# Patient Record
Sex: Female | Born: 1970 | Race: White | Hispanic: No | Marital: Married | Smoking: Never smoker
Health system: Southern US, Community
[De-identification: ages and names within clinical notes are randomized; demographics above are authoritative.]

## PROBLEM LIST (undated history)

## (undated) DIAGNOSIS — J45909 Unspecified asthma, uncomplicated: Secondary | ICD-10-CM

## (undated) DIAGNOSIS — I340 Nonrheumatic mitral (valve) insufficiency: Secondary | ICD-10-CM

## (undated) DIAGNOSIS — I480 Paroxysmal atrial fibrillation: Secondary | ICD-10-CM

## (undated) HISTORY — DX: Unspecified asthma, uncomplicated: J45.909

## (undated) HISTORY — DX: Nonrheumatic mitral (valve) insufficiency: I34.0

## (undated) HISTORY — DX: Paroxysmal atrial fibrillation: I48.0

---

## 2012-07-01 ENCOUNTER — Encounter (HOSPITAL_COMMUNITY): Payer: Self-pay

## 2012-07-01 ENCOUNTER — Emergency Department (HOSPITAL_COMMUNITY): Payer: BC Managed Care – PPO

## 2012-07-01 ENCOUNTER — Emergency Department (HOSPITAL_COMMUNITY)
Admission: EM | Admit: 2012-07-01 | Discharge: 2012-07-01 | Disposition: A | Discharge status: Home / Self Care | Payer: BC Managed Care – PPO | Attending: Emergency Medicine | Admitting: Emergency Medicine

## 2012-07-01 DIAGNOSIS — S93499A Sprain of other ligament of unspecified ankle, initial encounter: Secondary | ICD-10-CM | POA: Insufficient documentation

## 2012-07-01 DIAGNOSIS — X500XXA Overexertion from strenuous movement or load, initial encounter: Secondary | ICD-10-CM | POA: Insufficient documentation

## 2012-07-01 DIAGNOSIS — Y92009 Unspecified place in unspecified non-institutional (private) residence as the place of occurrence of the external cause: Secondary | ICD-10-CM | POA: Insufficient documentation

## 2012-07-01 DIAGNOSIS — Y9302 Activity, running: Secondary | ICD-10-CM | POA: Insufficient documentation

## 2012-07-01 DIAGNOSIS — S8253XA Displaced fracture of medial malleolus of unspecified tibia, initial encounter for closed fracture: Secondary | ICD-10-CM | POA: Insufficient documentation

## 2012-07-01 DIAGNOSIS — W010XXA Fall on same level from slipping, tripping and stumbling without subsequent striking against object, initial encounter: Secondary | ICD-10-CM | POA: Insufficient documentation

## 2012-07-01 MED ORDER — OXYCODONE-ACETAMINOPHEN 5-325 MG PO TABS: 1.0000 | ORAL_TABLET | ORAL | Status: DC | PRN

## 2012-07-01 NOTE — ED Provider Notes (Signed)
History     CSN: [id number]  Arrival date & time 07/01/12  2029   First MD Initiated Contact with Patient 07/01/12 2257      Chief Complaint  Patient presents with  . Ankle Pain  . Fall    (Consider location/radiation/quality/duration/timing/severity/associated sxs/prior treatment) Patient is a 42 y.o. female presenting with ankle pain and fall. The history is provided by the patient.  Ankle Pain Fall  She was running to try and prevent her tablet from falling off a deck when she slipped and twisted her left ankle. She's complaining about pain both medially and laterally. Pain is 8/10 if she moves her ankle but 4/10 at rest. She is unable to cannulate because of pain. She denies other injury.  History reviewed. No pertinent past medical history.  History reviewed. No pertinent past surgical history.  No family history on file.  History  Substance Use Topics  . Smoking status: Never Smoker   . Smokeless tobacco: Never Used  . Alcohol Use: No    OB History   Grav Para Term Preterm Abortions TAB SAB Ect Mult Living                  Review of Systems  All other systems reviewed and are negative.    Allergies  Cranberry  Home Medications  No current outpatient prescriptions on file.  BP 105/69  Pulse 91  Temp(Src) 97.7 F (36.5 C) (Oral)  Resp 13  SpO2 98%  LMP 06/24/2012  Physical Exam  Nursing note and vitals reviewed.  42 year old female, resting comfortably and in no acute distress. Vital signs are normal. Oxygen saturation is 98%, which is normal. Head is normocephalic and atraumatic. PERRLA, EOMI. Oropharynx is clear. Neck is nontender and supple without adenopathy or JVD. Back is nontender and there is no CVA tenderness. Lungs are clear without rales, wheezes, or rhonchi. Chest is nontender. Heart has regular rate and rhythm without murmur. Abdomen is soft, flat, nontender without masses or hepatosplenomegaly and peristalsis is  normoactive. Extremities have no cyanosis or edema, full range of motion is present. Left ankle has moderate swelling both medially and laterally with tenderness both medially and laterally. There is no instability of the ankle mortise and anterior drawer sign is negative. Distal pulses are strong and capillary refill is prompt presentation is normal. Skin is warm and dry without rash. Neurologic: Mental status is normal, cranial nerves are intact, there are no motor or sensory deficits.  ED Course  Procedures (including critical care time)  Labs Reviewed - No data to display Dg Ankle Complete Left  07/01/2012  *RADIOLOGY REPORT*  Clinical Data:  Pain and swelling, fell this morning  LEFT ANKLE COMPLETE - 3+ VIEW  Comparison: None  Findings: Soft tissue swelling greatest laterally. Ankle mortise intact. Osseous mineralization normal. Small plantar calcaneal spur. Tiny avulsion fracture identified at tip of medial malleolus. No additional fracture, dislocation, or bone destruction.  IMPRESSION: Tiny avulsion fracture at tip of medial malleolus. Lateral soft tissue swelling without definite fracture.   Original Report Authenticated By: Ulyses Southward, M.D.      1. Fall at home, initial encounter   2. Avulsion fracture of ankle, left, closed, initial encounter       MDM  Fall with sprain of left ankle with avulsion fracture of medial malleolus. She is placed in a Cam Walker and given crutches and given prescription for Percocet for pain. She is referred to orthopedics for followup.  Dione Booze, MD 07/01/12 [phone]

## 2012-07-01 NOTE — ED Notes (Signed)
Patient presents with c/o left ankle pain and swelling after falling down 3 porch steps. No dizziness or syncope. Denies hitting head. No LOC. Patient states that she was trying to prevent her toddler son from going down the steps and she lost her balance. States that she felt a "snap, crackle and popping" sensation immediately after the fall. Reports difficulty with ambulation d/t pain. States that the application of pressure to the LLE causes intense pain.

## 2012-07-01 NOTE — Progress Notes (Signed)
Orthopedic Tech Progress Note Patient Details:  Sarah Cuevas 1970-05-19 [id number]  Ortho Devices Type of Ortho Device: Crutches;CAM walker   Haskell Flirt 07/01/2012, 11:29 PM

## 2012-07-01 NOTE — ED Notes (Signed)
Ice pack provided to patient to apply to left ankle injury

## 2012-11-08 ENCOUNTER — Other Ambulatory Visit (HOSPITAL_COMMUNITY): Payer: Self-pay | Admitting: Family Medicine

## 2012-11-08 DIAGNOSIS — Z1231 Encounter for screening mammogram for malignant neoplasm of breast: Secondary | ICD-10-CM

## 2012-11-15 ENCOUNTER — Ambulatory Visit (HOSPITAL_COMMUNITY)
Admission: RE | Admit: 2012-11-15 | Discharge: 2012-11-15 | Disposition: A | Discharge status: Home / Self Care | Payer: BC Managed Care – PPO | Source: Ambulatory Visit | Attending: Family Medicine | Admitting: Family Medicine

## 2012-11-15 DIAGNOSIS — Z1231 Encounter for screening mammogram for malignant neoplasm of breast: Secondary | ICD-10-CM | POA: Insufficient documentation

## 2013-05-28 ENCOUNTER — Ambulatory Visit (INDEPENDENT_AMBULATORY_CARE_PROVIDER_SITE_OTHER): Payer: BC Managed Care – PPO | Admitting: *Deleted

## 2013-05-28 VITALS — BP 142/91 | Ht 68.0 in | Wt 209.6 lb

## 2013-05-28 DIAGNOSIS — B379 Candidiasis, unspecified: Secondary | ICD-10-CM

## 2013-05-28 DIAGNOSIS — O09529 Supervision of elderly multigravida, unspecified trimester: Secondary | ICD-10-CM

## 2013-05-28 DIAGNOSIS — IMO0002 Reserved for concepts with insufficient information to code with codable children: Secondary | ICD-10-CM

## 2013-05-28 NOTE — Progress Notes (Signed)
P-95  New ob/  Labs done.

## 2013-05-29 LAB — OBSTETRIC PANEL
Antibody Screen: NEGATIVE
BASOS ABS: 0 10*3/uL (ref 0.0–0.1)
Basophils Relative: 0 % (ref 0–1)
EOS PCT: 2 % (ref 0–5)
Eosinophils Absolute: 0.1 10*3/uL (ref 0.0–0.7)
HCT: 40.8 % (ref 36.0–46.0)
Hemoglobin: 14.2 g/dL (ref 12.0–15.0)
Hepatitis B Surface Ag: NEGATIVE
LYMPHS PCT: 21 % (ref 12–46)
Lymphs Abs: 1.7 10*3/uL (ref 0.7–4.0)
MCH: 30.7 pg (ref 26.0–34.0)
MCHC: 34.8 g/dL (ref 30.0–36.0)
MCV: 88.1 fL (ref 78.0–100.0)
Monocytes Absolute: 0.8 10*3/uL (ref 0.1–1.0)
Monocytes Relative: 11 % (ref 3–12)
NEUTROS ABS: 5.1 10*3/uL (ref 1.7–7.7)
Neutrophils Relative %: 66 % (ref 43–77)
PLATELETS: 259 10*3/uL (ref 150–400)
RBC: 4.63 MIL/uL (ref 3.87–5.11)
RDW: 14.1 % (ref 11.5–15.5)
RUBELLA: 1.08 {index} — AB (ref <0.90)
Rh Type: POSITIVE
WBC: 7.8 10*3/uL (ref 4.0–10.5)

## 2013-05-29 LAB — HIV ANTIBODY (ROUTINE TESTING W REFLEX): HIV: NONREACTIVE

## 2013-05-29 LAB — VARICELLA ZOSTER ANTIBODY, IGG: VARICELLA IGG: 812.5 {index} — AB (ref <135.00)

## 2013-05-29 MED ORDER — FLUCONAZOLE 150 MG PO TABS: 150.0000 mg | ORAL_TABLET | Freq: Every day | ORAL | Status: DC

## 2013-05-29 NOTE — Progress Notes (Signed)
Patient also complains of increased itching and white clumpy discharge.  She has had yeast infection in the past and feels like this is what is going on.  She has used Diflucan in the past with good result.  I will call this in for her.

## 2013-05-29 NOTE — Addendum Note (Signed)
Addended by: Barbara CowerNOGUES, Lian Tanori L on: 05/29/2013 11:26 AM   Modules accepted: Orders

## 2013-05-30 LAB — CULTURE, OB URINE

## 2013-05-30 LAB — CYSTIC FIBROSIS DIAGNOSTIC STUDY

## 2013-06-10 ENCOUNTER — Encounter: Payer: BC Managed Care – PPO | Admitting: Family Medicine

## 2014-04-02 ENCOUNTER — Encounter (HOSPITAL_COMMUNITY): Payer: Self-pay | Admitting: *Deleted

## 2014-08-12 ENCOUNTER — Other Ambulatory Visit: Payer: Self-pay | Admitting: Obstetrics & Gynecology

## 2014-08-12 DIAGNOSIS — R928 Other abnormal and inconclusive findings on diagnostic imaging of breast: Secondary | ICD-10-CM

## 2014-08-17 ENCOUNTER — Ambulatory Visit
Admission: RE | Admit: 2014-08-17 | Discharge: 2014-08-17 | Disposition: A | Discharge status: Home / Self Care | Payer: BLUE CROSS/BLUE SHIELD | Source: Ambulatory Visit | Attending: Obstetrics & Gynecology | Admitting: Obstetrics & Gynecology

## 2014-08-17 DIAGNOSIS — R928 Other abnormal and inconclusive findings on diagnostic imaging of breast: Secondary | ICD-10-CM

## 2015-01-25 ENCOUNTER — Other Ambulatory Visit: Payer: Self-pay | Admitting: Obstetrics & Gynecology

## 2015-01-25 DIAGNOSIS — N6489 Other specified disorders of breast: Secondary | ICD-10-CM

## 2015-02-17 ENCOUNTER — Ambulatory Visit
Admission: RE | Admit: 2015-02-17 | Discharge: 2015-02-17 | Disposition: A | Discharge status: Home / Self Care | Payer: BLUE CROSS/BLUE SHIELD | Source: Ambulatory Visit | Attending: Obstetrics & Gynecology | Admitting: Obstetrics & Gynecology

## 2015-02-17 ENCOUNTER — Other Ambulatory Visit: Payer: Self-pay | Admitting: Obstetrics & Gynecology

## 2015-02-17 DIAGNOSIS — N6489 Other specified disorders of breast: Secondary | ICD-10-CM

## 2015-11-15 ENCOUNTER — Other Ambulatory Visit: Payer: Self-pay | Admitting: Obstetrics & Gynecology

## 2015-11-15 DIAGNOSIS — N6489 Other specified disorders of breast: Secondary | ICD-10-CM

## 2015-11-22 ENCOUNTER — Ambulatory Visit
Admission: RE | Admit: 2015-11-22 | Discharge: 2015-11-22 | Disposition: A | Discharge status: Home / Self Care | Payer: BLUE CROSS/BLUE SHIELD | Source: Ambulatory Visit | Attending: Obstetrics & Gynecology | Admitting: Obstetrics & Gynecology

## 2015-11-22 DIAGNOSIS — N6489 Other specified disorders of breast: Secondary | ICD-10-CM

## 2016-10-31 ENCOUNTER — Other Ambulatory Visit: Payer: Self-pay | Admitting: Obstetrics & Gynecology

## 2016-10-31 DIAGNOSIS — N6320 Unspecified lump in the left breast, unspecified quadrant: Secondary | ICD-10-CM

## 2016-11-21 ENCOUNTER — Other Ambulatory Visit: Payer: Self-pay | Admitting: Obstetrics & Gynecology

## 2016-11-21 DIAGNOSIS — N6489 Other specified disorders of breast: Secondary | ICD-10-CM

## 2016-11-22 ENCOUNTER — Ambulatory Visit
Admission: RE | Admit: 2016-11-22 | Discharge: 2016-11-22 | Disposition: A | Discharge status: Home / Self Care | Payer: BLUE CROSS/BLUE SHIELD | Source: Ambulatory Visit | Attending: Obstetrics & Gynecology | Admitting: Obstetrics & Gynecology

## 2016-11-22 ENCOUNTER — Ambulatory Visit: Admission: RE | Admit: 2016-11-22 | Payer: BLUE CROSS/BLUE SHIELD | Source: Ambulatory Visit

## 2016-11-22 DIAGNOSIS — N6489 Other specified disorders of breast: Secondary | ICD-10-CM

## 2017-03-12 DIAGNOSIS — J0100 Acute maxillary sinusitis, unspecified: Secondary | ICD-10-CM | POA: Diagnosis not present

## 2017-05-24 DIAGNOSIS — N926 Irregular menstruation, unspecified: Secondary | ICD-10-CM | POA: Diagnosis not present

## 2017-06-02 DIAGNOSIS — J111 Influenza due to unidentified influenza virus with other respiratory manifestations: Secondary | ICD-10-CM | POA: Diagnosis not present

## 2017-06-02 DIAGNOSIS — J45909 Unspecified asthma, uncomplicated: Secondary | ICD-10-CM | POA: Diagnosis not present

## 2017-06-06 DIAGNOSIS — J40 Bronchitis, not specified as acute or chronic: Secondary | ICD-10-CM | POA: Diagnosis not present

## 2017-06-06 DIAGNOSIS — R404 Transient alteration of awareness: Secondary | ICD-10-CM | POA: Diagnosis not present

## 2017-06-06 DIAGNOSIS — I480 Paroxysmal atrial fibrillation: Secondary | ICD-10-CM | POA: Insufficient documentation

## 2017-06-06 DIAGNOSIS — R55 Syncope and collapse: Secondary | ICD-10-CM | POA: Diagnosis not present

## 2017-06-06 DIAGNOSIS — J111 Influenza due to unidentified influenza virus with other respiratory manifestations: Secondary | ICD-10-CM | POA: Diagnosis not present

## 2017-06-06 DIAGNOSIS — E876 Hypokalemia: Secondary | ICD-10-CM | POA: Diagnosis not present

## 2017-06-06 DIAGNOSIS — J09X2 Influenza due to identified novel influenza A virus with other respiratory manifestations: Secondary | ICD-10-CM | POA: Diagnosis not present

## 2017-06-06 DIAGNOSIS — E860 Dehydration: Secondary | ICD-10-CM | POA: Diagnosis not present

## 2017-06-06 DIAGNOSIS — R062 Wheezing: Secondary | ICD-10-CM | POA: Diagnosis not present

## 2017-06-06 DIAGNOSIS — I4891 Unspecified atrial fibrillation: Secondary | ICD-10-CM | POA: Diagnosis not present

## 2017-06-06 DIAGNOSIS — J4521 Mild intermittent asthma with (acute) exacerbation: Secondary | ICD-10-CM | POA: Insufficient documentation

## 2017-06-06 DIAGNOSIS — R0602 Shortness of breath: Secondary | ICD-10-CM | POA: Diagnosis not present

## 2017-06-06 DIAGNOSIS — I081 Rheumatic disorders of both mitral and tricuspid valves: Secondary | ICD-10-CM | POA: Diagnosis not present

## 2017-06-06 DIAGNOSIS — R05 Cough: Secondary | ICD-10-CM | POA: Diagnosis not present

## 2017-06-06 DIAGNOSIS — J112 Influenza due to unidentified influenza virus with gastrointestinal manifestations: Secondary | ICD-10-CM | POA: Diagnosis not present

## 2017-06-06 DIAGNOSIS — R000 Tachycardia, unspecified: Secondary | ICD-10-CM | POA: Diagnosis not present

## 2017-06-07 DIAGNOSIS — J40 Bronchitis, not specified as acute or chronic: Secondary | ICD-10-CM | POA: Diagnosis not present

## 2017-06-07 DIAGNOSIS — I340 Nonrheumatic mitral (valve) insufficiency: Secondary | ICD-10-CM | POA: Diagnosis not present

## 2017-06-07 DIAGNOSIS — R000 Tachycardia, unspecified: Secondary | ICD-10-CM | POA: Diagnosis not present

## 2017-06-07 DIAGNOSIS — I480 Paroxysmal atrial fibrillation: Secondary | ICD-10-CM | POA: Diagnosis not present

## 2017-06-07 DIAGNOSIS — I071 Rheumatic tricuspid insufficiency: Secondary | ICD-10-CM | POA: Diagnosis not present

## 2017-06-07 DIAGNOSIS — J4521 Mild intermittent asthma with (acute) exacerbation: Secondary | ICD-10-CM | POA: Diagnosis not present

## 2017-06-07 DIAGNOSIS — J112 Influenza due to unidentified influenza virus with gastrointestinal manifestations: Secondary | ICD-10-CM | POA: Diagnosis not present

## 2017-06-07 DIAGNOSIS — I4891 Unspecified atrial fibrillation: Secondary | ICD-10-CM | POA: Diagnosis not present

## 2017-06-07 DIAGNOSIS — J111 Influenza due to unidentified influenza virus with other respiratory manifestations: Secondary | ICD-10-CM | POA: Diagnosis not present

## 2017-06-14 DIAGNOSIS — I480 Paroxysmal atrial fibrillation: Secondary | ICD-10-CM | POA: Diagnosis not present

## 2017-06-14 DIAGNOSIS — J40 Bronchitis, not specified as acute or chronic: Secondary | ICD-10-CM | POA: Diagnosis not present

## 2017-06-14 DIAGNOSIS — J4521 Mild intermittent asthma with (acute) exacerbation: Secondary | ICD-10-CM | POA: Diagnosis not present

## 2017-06-15 DIAGNOSIS — I480 Paroxysmal atrial fibrillation: Secondary | ICD-10-CM | POA: Diagnosis not present

## 2017-06-19 DIAGNOSIS — I4891 Unspecified atrial fibrillation: Secondary | ICD-10-CM | POA: Diagnosis not present

## 2017-06-19 DIAGNOSIS — Z13220 Encounter for screening for lipoid disorders: Secondary | ICD-10-CM | POA: Diagnosis not present

## 2017-06-19 DIAGNOSIS — Z131 Encounter for screening for diabetes mellitus: Secondary | ICD-10-CM | POA: Diagnosis not present

## 2017-06-19 DIAGNOSIS — Z79899 Other long term (current) drug therapy: Secondary | ICD-10-CM | POA: Diagnosis not present

## 2017-06-19 DIAGNOSIS — R030 Elevated blood-pressure reading, without diagnosis of hypertension: Secondary | ICD-10-CM | POA: Diagnosis not present

## 2017-07-04 DIAGNOSIS — I480 Paroxysmal atrial fibrillation: Secondary | ICD-10-CM | POA: Diagnosis not present

## 2017-07-04 DIAGNOSIS — Z7982 Long term (current) use of aspirin: Secondary | ICD-10-CM | POA: Insufficient documentation

## 2017-07-10 DIAGNOSIS — R030 Elevated blood-pressure reading, without diagnosis of hypertension: Secondary | ICD-10-CM | POA: Diagnosis not present

## 2017-07-10 DIAGNOSIS — Z6834 Body mass index (BMI) 34.0-34.9, adult: Secondary | ICD-10-CM | POA: Diagnosis not present

## 2017-07-10 DIAGNOSIS — I4891 Unspecified atrial fibrillation: Secondary | ICD-10-CM | POA: Diagnosis not present

## 2017-07-10 DIAGNOSIS — Z1331 Encounter for screening for depression: Secondary | ICD-10-CM | POA: Diagnosis not present

## 2017-08-20 DIAGNOSIS — H66009 Acute suppurative otitis media without spontaneous rupture of ear drum, unspecified ear: Secondary | ICD-10-CM | POA: Diagnosis not present

## 2017-10-06 DIAGNOSIS — R0602 Shortness of breath: Secondary | ICD-10-CM | POA: Diagnosis not present

## 2017-10-06 DIAGNOSIS — J45909 Unspecified asthma, uncomplicated: Secondary | ICD-10-CM | POA: Diagnosis not present

## 2017-10-06 DIAGNOSIS — I509 Heart failure, unspecified: Secondary | ICD-10-CM | POA: Diagnosis not present

## 2017-10-06 DIAGNOSIS — J209 Acute bronchitis, unspecified: Secondary | ICD-10-CM | POA: Diagnosis not present

## 2017-11-14 DIAGNOSIS — R062 Wheezing: Secondary | ICD-10-CM | POA: Diagnosis not present

## 2017-11-14 DIAGNOSIS — J45909 Unspecified asthma, uncomplicated: Secondary | ICD-10-CM | POA: Diagnosis not present

## 2017-11-14 DIAGNOSIS — J309 Allergic rhinitis, unspecified: Secondary | ICD-10-CM | POA: Diagnosis not present

## 2017-11-14 DIAGNOSIS — Z6835 Body mass index (BMI) 35.0-35.9, adult: Secondary | ICD-10-CM | POA: Diagnosis not present

## 2017-12-04 DIAGNOSIS — J45909 Unspecified asthma, uncomplicated: Secondary | ICD-10-CM | POA: Diagnosis not present

## 2017-12-04 DIAGNOSIS — Z6834 Body mass index (BMI) 34.0-34.9, adult: Secondary | ICD-10-CM | POA: Diagnosis not present

## 2017-12-04 DIAGNOSIS — H6980 Other specified disorders of Eustachian tube, unspecified ear: Secondary | ICD-10-CM | POA: Diagnosis not present

## 2017-12-04 DIAGNOSIS — E782 Mixed hyperlipidemia: Secondary | ICD-10-CM | POA: Diagnosis not present

## 2018-01-08 DIAGNOSIS — I480 Paroxysmal atrial fibrillation: Secondary | ICD-10-CM | POA: Diagnosis not present

## 2018-01-08 DIAGNOSIS — Z7982 Long term (current) use of aspirin: Secondary | ICD-10-CM | POA: Diagnosis not present

## 2018-01-24 DIAGNOSIS — J45909 Unspecified asthma, uncomplicated: Secondary | ICD-10-CM | POA: Diagnosis not present

## 2018-01-24 DIAGNOSIS — Z6834 Body mass index (BMI) 34.0-34.9, adult: Secondary | ICD-10-CM | POA: Diagnosis not present

## 2018-01-24 DIAGNOSIS — J309 Allergic rhinitis, unspecified: Secondary | ICD-10-CM | POA: Diagnosis not present

## 2018-01-28 IMAGING — MG 2D DIGITAL DIAGNOSTIC BILATERAL MAMMOGRAM WITH CAD AND ADJUNCT T
3 series · 3 of 11 positions shown · non-contrast
Comparison: Previous exam(s).

CLINICAL DATA: Two year follow-up of a focal asymmetry in the upper
outer left breast.

EXAM:
2D DIGITAL DIAGNOSTIC BILATERAL MAMMOGRAM WITH CAD AND ADJUNCT TOMO

[R CC synth-2D]
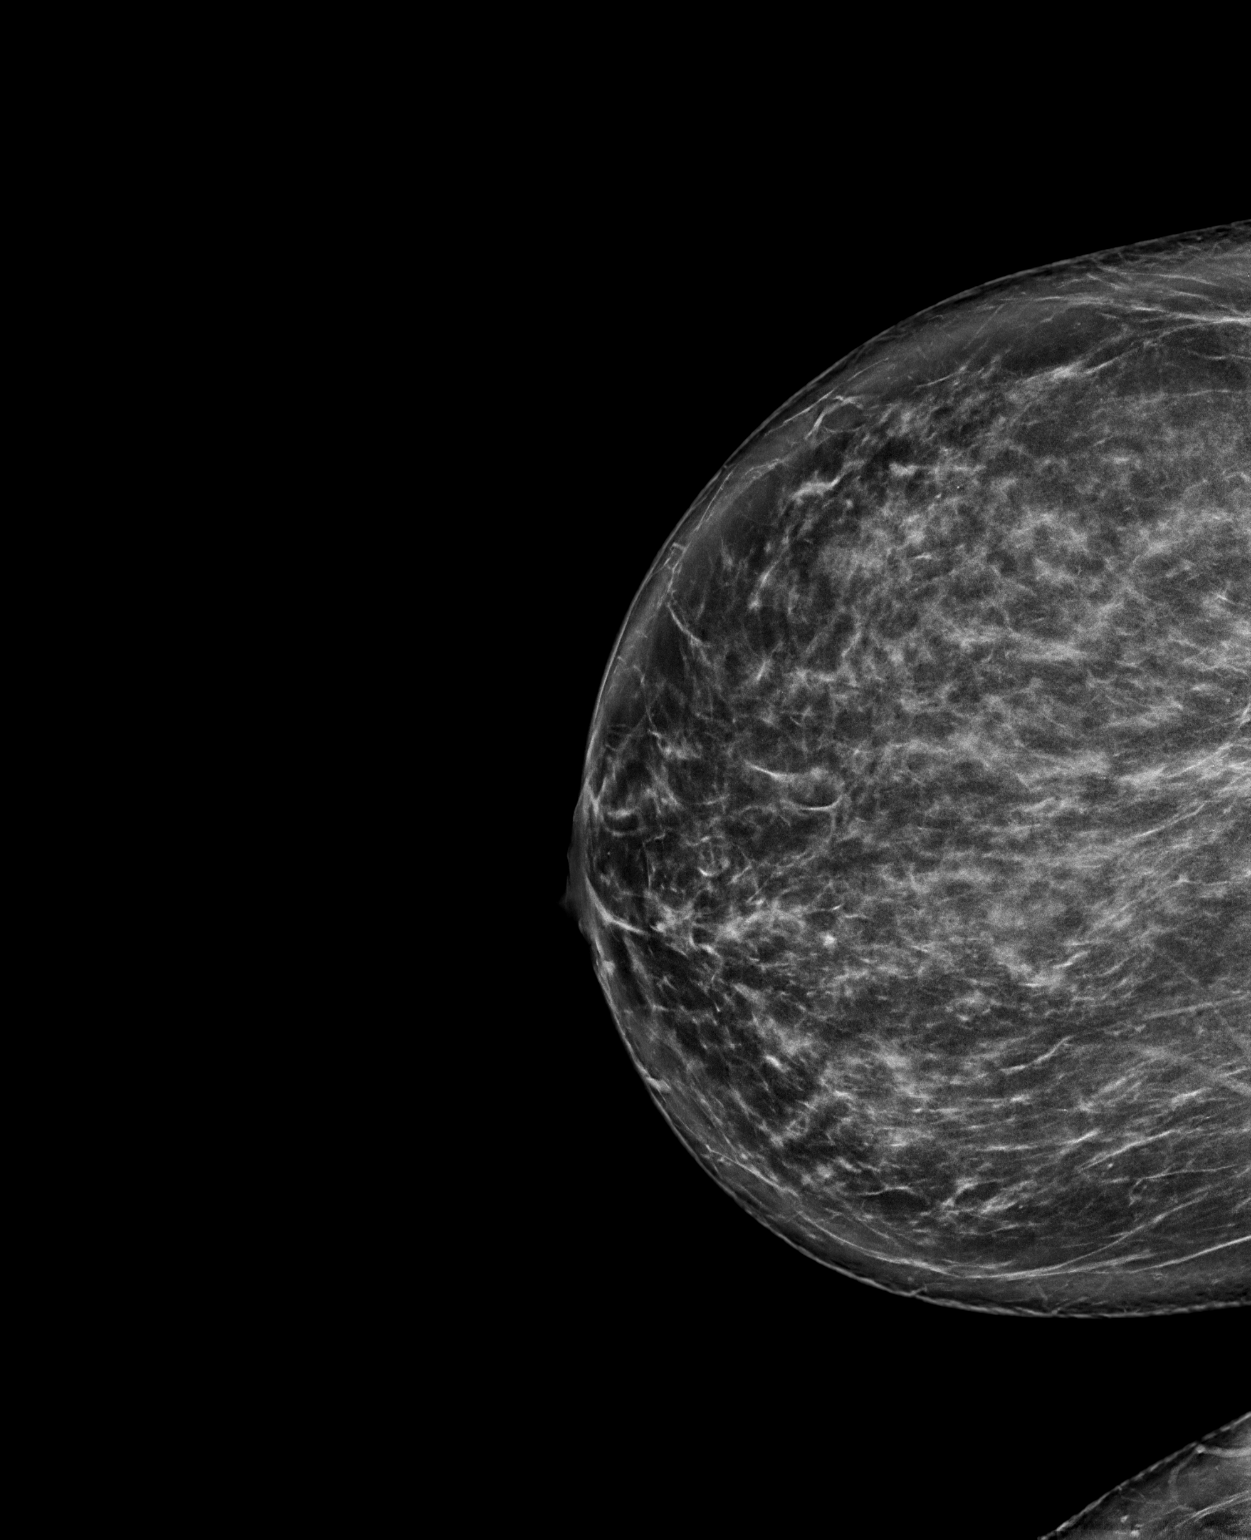

[L MLO tomo · tomo slice 50/99.0]
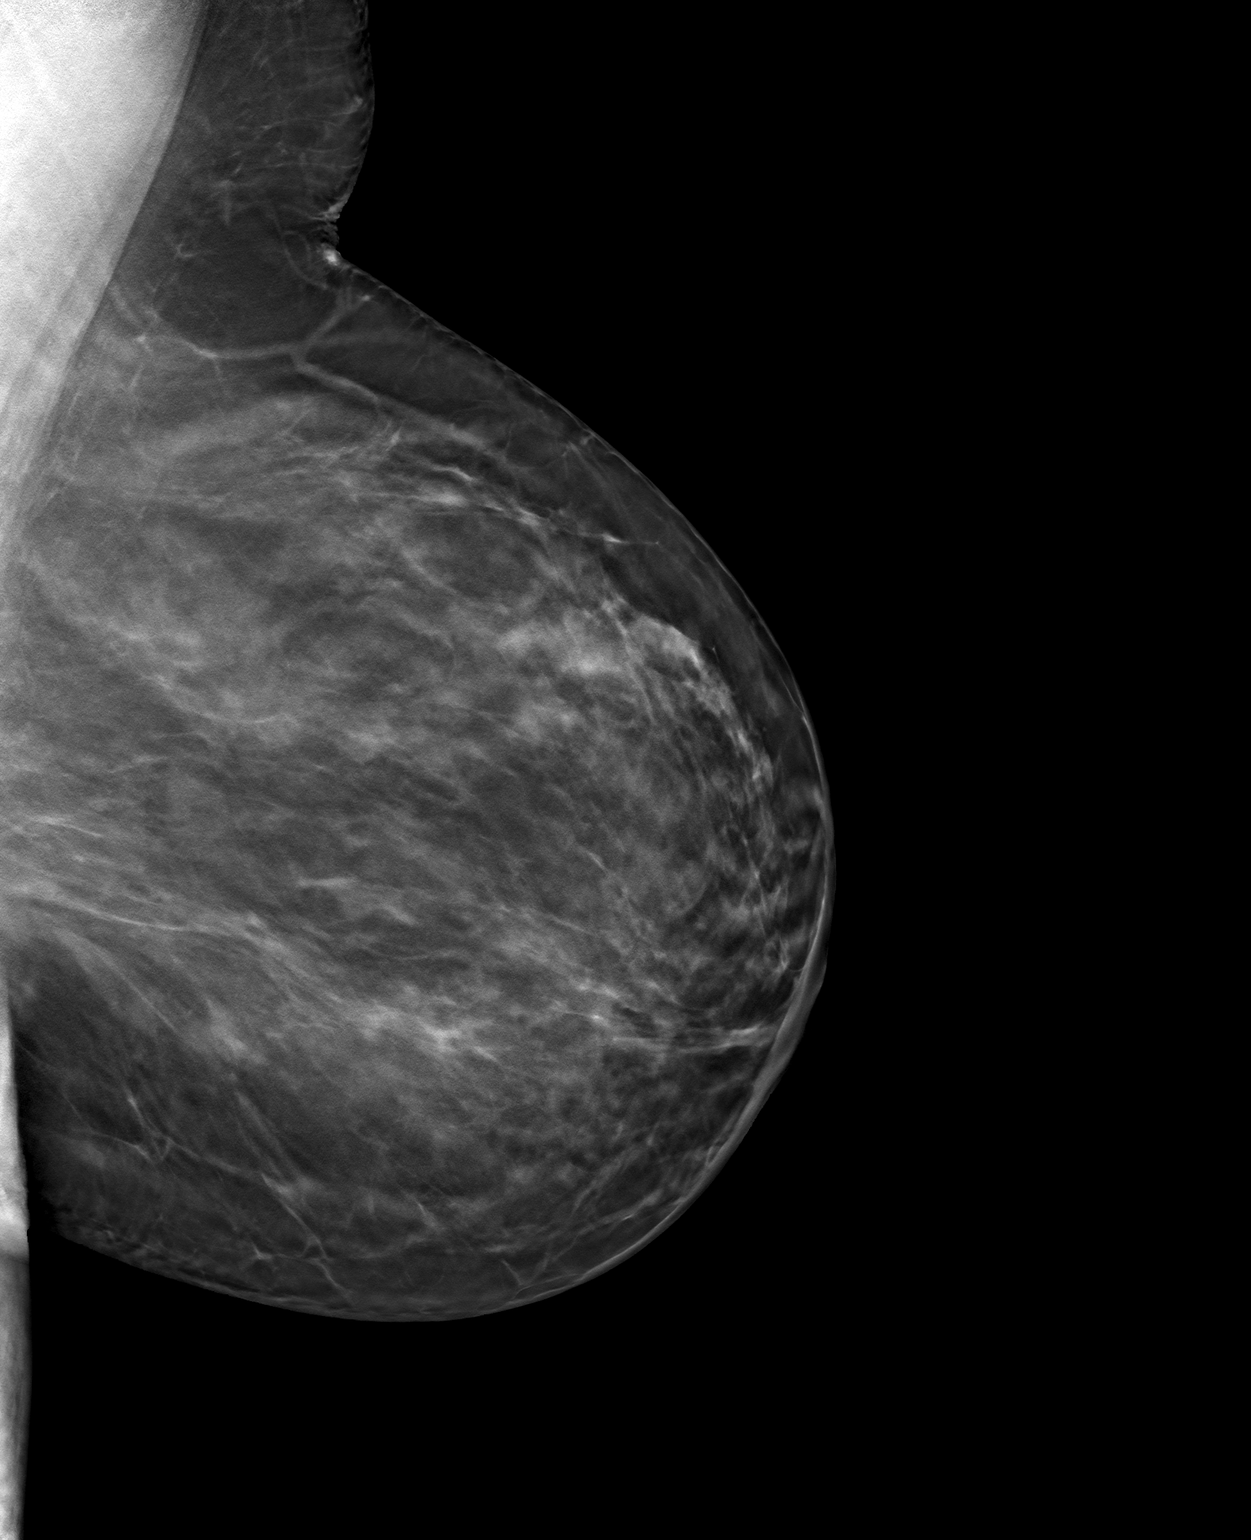

[L CC tomo · tomo slice 41/81.0]
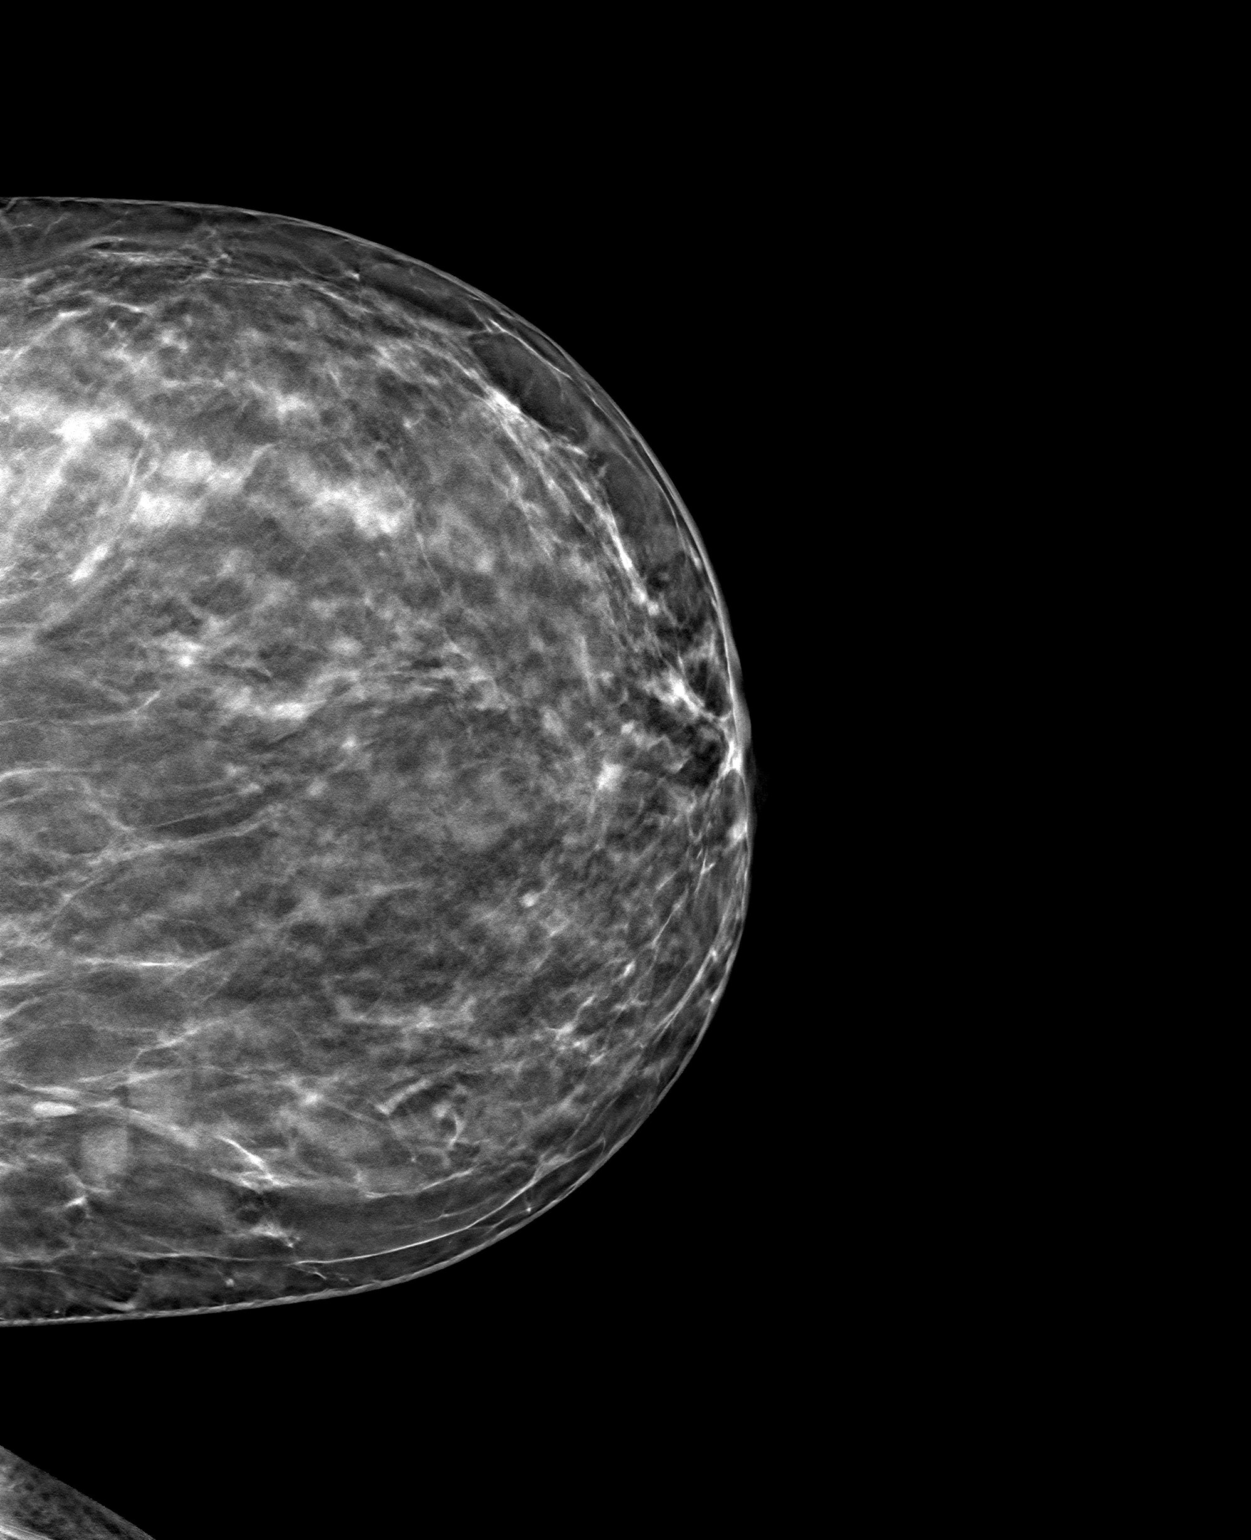

[3 of 11 positions shown; findings below may reference images not displayed]

ACR Breast Density Category c: The breast tissue is heterogeneously
dense, which may obscure small masses.
FINDINGS: The focal asymmetry in the upper outer left breast has been stable
for 2 years. No interval changes or suspicious findings.

Mammographic images were processed with CAD.
IMPRESSION: No mammographic evidence of malignancy.

RECOMMENDATION:
Annual screening mammography.

I have discussed the findings and recommendations with the patient.
Results were also provided in writing at the conclusion of the
visit. If applicable, a reminder letter will be sent to the patient
regarding the next appointment.

BI-RADS CATEGORY  3: Probably benign.

## 2018-02-20 DIAGNOSIS — I480 Paroxysmal atrial fibrillation: Secondary | ICD-10-CM | POA: Diagnosis not present

## 2018-02-20 DIAGNOSIS — R079 Chest pain, unspecified: Secondary | ICD-10-CM | POA: Diagnosis not present

## 2018-02-20 DIAGNOSIS — I4891 Unspecified atrial fibrillation: Secondary | ICD-10-CM | POA: Diagnosis not present

## 2018-02-20 DIAGNOSIS — R0789 Other chest pain: Secondary | ICD-10-CM | POA: Diagnosis not present

## 2018-02-20 DIAGNOSIS — R0602 Shortness of breath: Secondary | ICD-10-CM | POA: Diagnosis not present

## 2018-02-20 DIAGNOSIS — I4892 Unspecified atrial flutter: Secondary | ICD-10-CM | POA: Diagnosis not present

## 2018-02-22 DIAGNOSIS — I480 Paroxysmal atrial fibrillation: Secondary | ICD-10-CM | POA: Diagnosis not present

## 2018-02-22 DIAGNOSIS — Z7982 Long term (current) use of aspirin: Secondary | ICD-10-CM | POA: Diagnosis not present

## 2018-02-27 DIAGNOSIS — I480 Paroxysmal atrial fibrillation: Secondary | ICD-10-CM | POA: Diagnosis not present

## 2018-02-27 DIAGNOSIS — Z7982 Long term (current) use of aspirin: Secondary | ICD-10-CM | POA: Diagnosis not present

## 2018-02-28 DIAGNOSIS — Z7982 Long term (current) use of aspirin: Secondary | ICD-10-CM | POA: Diagnosis not present

## 2018-02-28 DIAGNOSIS — I480 Paroxysmal atrial fibrillation: Secondary | ICD-10-CM | POA: Diagnosis not present

## 2018-03-07 DIAGNOSIS — J029 Acute pharyngitis, unspecified: Secondary | ICD-10-CM | POA: Diagnosis not present

## 2018-03-07 DIAGNOSIS — Z6833 Body mass index (BMI) 33.0-33.9, adult: Secondary | ICD-10-CM | POA: Diagnosis not present

## 2018-03-07 DIAGNOSIS — Z23 Encounter for immunization: Secondary | ICD-10-CM | POA: Diagnosis not present

## 2018-03-07 DIAGNOSIS — I480 Paroxysmal atrial fibrillation: Secondary | ICD-10-CM | POA: Diagnosis not present

## 2018-03-07 DIAGNOSIS — J45909 Unspecified asthma, uncomplicated: Secondary | ICD-10-CM | POA: Diagnosis not present

## 2018-03-12 DIAGNOSIS — I480 Paroxysmal atrial fibrillation: Secondary | ICD-10-CM | POA: Diagnosis not present

## 2018-03-12 DIAGNOSIS — J4521 Mild intermittent asthma with (acute) exacerbation: Secondary | ICD-10-CM | POA: Diagnosis not present

## 2018-03-12 DIAGNOSIS — J40 Bronchitis, not specified as acute or chronic: Secondary | ICD-10-CM | POA: Diagnosis not present

## 2018-03-12 DIAGNOSIS — I4819 Other persistent atrial fibrillation: Secondary | ICD-10-CM | POA: Diagnosis not present

## 2018-03-13 DIAGNOSIS — I4891 Unspecified atrial fibrillation: Secondary | ICD-10-CM | POA: Diagnosis not present

## 2018-03-27 DIAGNOSIS — I480 Paroxysmal atrial fibrillation: Secondary | ICD-10-CM | POA: Diagnosis not present

## 2018-03-27 DIAGNOSIS — Z01818 Encounter for other preprocedural examination: Secondary | ICD-10-CM | POA: Diagnosis not present

## 2018-03-28 DIAGNOSIS — Z7901 Long term (current) use of anticoagulants: Secondary | ICD-10-CM | POA: Diagnosis not present

## 2018-03-28 DIAGNOSIS — Z79899 Other long term (current) drug therapy: Secondary | ICD-10-CM | POA: Diagnosis not present

## 2018-03-28 DIAGNOSIS — I480 Paroxysmal atrial fibrillation: Secondary | ICD-10-CM | POA: Diagnosis not present

## 2018-03-29 DIAGNOSIS — R9431 Abnormal electrocardiogram [ECG] [EKG]: Secondary | ICD-10-CM | POA: Diagnosis not present

## 2018-04-18 DIAGNOSIS — I480 Paroxysmal atrial fibrillation: Secondary | ICD-10-CM | POA: Diagnosis not present

## 2018-04-18 DIAGNOSIS — Z6833 Body mass index (BMI) 33.0-33.9, adult: Secondary | ICD-10-CM | POA: Diagnosis not present

## 2018-04-18 DIAGNOSIS — J45909 Unspecified asthma, uncomplicated: Secondary | ICD-10-CM | POA: Diagnosis not present

## 2018-05-03 DIAGNOSIS — J40 Bronchitis, not specified as acute or chronic: Secondary | ICD-10-CM | POA: Diagnosis not present

## 2018-05-03 DIAGNOSIS — I480 Paroxysmal atrial fibrillation: Secondary | ICD-10-CM | POA: Diagnosis not present

## 2018-05-03 DIAGNOSIS — I4819 Other persistent atrial fibrillation: Secondary | ICD-10-CM | POA: Diagnosis not present

## 2018-05-03 DIAGNOSIS — Z7982 Long term (current) use of aspirin: Secondary | ICD-10-CM | POA: Diagnosis not present

## 2018-05-04 DIAGNOSIS — R001 Bradycardia, unspecified: Secondary | ICD-10-CM | POA: Diagnosis not present

## 2018-06-20 DIAGNOSIS — Z13220 Encounter for screening for lipoid disorders: Secondary | ICD-10-CM | POA: Diagnosis not present

## 2018-06-20 DIAGNOSIS — I480 Paroxysmal atrial fibrillation: Secondary | ICD-10-CM | POA: Diagnosis not present

## 2018-06-20 DIAGNOSIS — Z6833 Body mass index (BMI) 33.0-33.9, adult: Secondary | ICD-10-CM | POA: Diagnosis not present

## 2018-06-20 DIAGNOSIS — Z01419 Encounter for gynecological examination (general) (routine) without abnormal findings: Secondary | ICD-10-CM | POA: Diagnosis not present

## 2018-11-29 DIAGNOSIS — I4891 Unspecified atrial fibrillation: Secondary | ICD-10-CM | POA: Diagnosis not present

## 2018-11-29 DIAGNOSIS — J4521 Mild intermittent asthma with (acute) exacerbation: Secondary | ICD-10-CM | POA: Diagnosis not present

## 2018-11-29 DIAGNOSIS — Z7982 Long term (current) use of aspirin: Secondary | ICD-10-CM | POA: Diagnosis not present

## 2018-11-29 DIAGNOSIS — I480 Paroxysmal atrial fibrillation: Secondary | ICD-10-CM | POA: Diagnosis not present

## 2018-12-04 DIAGNOSIS — S93401A Sprain of unspecified ligament of right ankle, initial encounter: Secondary | ICD-10-CM | POA: Diagnosis not present

## 2018-12-19 DIAGNOSIS — J45909 Unspecified asthma, uncomplicated: Secondary | ICD-10-CM | POA: Diagnosis not present

## 2018-12-19 DIAGNOSIS — Z1331 Encounter for screening for depression: Secondary | ICD-10-CM | POA: Diagnosis not present

## 2018-12-19 DIAGNOSIS — I480 Paroxysmal atrial fibrillation: Secondary | ICD-10-CM | POA: Diagnosis not present

## 2018-12-19 DIAGNOSIS — I10 Essential (primary) hypertension: Secondary | ICD-10-CM | POA: Diagnosis not present

## 2019-01-16 DIAGNOSIS — I480 Paroxysmal atrial fibrillation: Secondary | ICD-10-CM | POA: Diagnosis not present

## 2019-01-16 DIAGNOSIS — Z7901 Long term (current) use of anticoagulants: Secondary | ICD-10-CM | POA: Diagnosis not present

## 2019-02-05 DIAGNOSIS — I480 Paroxysmal atrial fibrillation: Secondary | ICD-10-CM | POA: Diagnosis not present

## 2019-06-05 DIAGNOSIS — I480 Paroxysmal atrial fibrillation: Secondary | ICD-10-CM | POA: Diagnosis not present

## 2019-06-05 DIAGNOSIS — I340 Nonrheumatic mitral (valve) insufficiency: Secondary | ICD-10-CM | POA: Diagnosis not present

## 2019-06-05 DIAGNOSIS — J4521 Mild intermittent asthma with (acute) exacerbation: Secondary | ICD-10-CM | POA: Diagnosis not present

## 2019-06-05 DIAGNOSIS — Z7901 Long term (current) use of anticoagulants: Secondary | ICD-10-CM | POA: Diagnosis not present

## 2019-06-25 DIAGNOSIS — Z131 Encounter for screening for diabetes mellitus: Secondary | ICD-10-CM | POA: Diagnosis not present

## 2019-06-25 DIAGNOSIS — Z13220 Encounter for screening for lipoid disorders: Secondary | ICD-10-CM | POA: Diagnosis not present

## 2019-06-25 DIAGNOSIS — J45909 Unspecified asthma, uncomplicated: Secondary | ICD-10-CM | POA: Diagnosis not present

## 2019-06-25 DIAGNOSIS — Z01419 Encounter for gynecological examination (general) (routine) without abnormal findings: Secondary | ICD-10-CM | POA: Diagnosis not present

## 2019-06-25 DIAGNOSIS — Z6833 Body mass index (BMI) 33.0-33.9, adult: Secondary | ICD-10-CM | POA: Diagnosis not present

## 2019-09-08 DIAGNOSIS — Z1231 Encounter for screening mammogram for malignant neoplasm of breast: Secondary | ICD-10-CM | POA: Diagnosis not present

## 2020-05-17 ENCOUNTER — Ambulatory Visit: Payer: Self-pay | Admitting: Psychology

## 2020-09-08 ENCOUNTER — Ambulatory Visit (INDEPENDENT_AMBULATORY_CARE_PROVIDER_SITE_OTHER): Payer: BC Managed Care – PPO | Admitting: Sports Medicine

## 2020-09-08 ENCOUNTER — Other Ambulatory Visit: Payer: Self-pay

## 2020-09-08 ENCOUNTER — Encounter: Payer: Self-pay | Admitting: Sports Medicine

## 2020-09-08 DIAGNOSIS — M2042 Other hammer toe(s) (acquired), left foot: Secondary | ICD-10-CM

## 2020-09-08 DIAGNOSIS — M79674 Pain in right toe(s): Secondary | ICD-10-CM | POA: Diagnosis not present

## 2020-09-08 DIAGNOSIS — M2041 Other hammer toe(s) (acquired), right foot: Secondary | ICD-10-CM

## 2020-09-08 DIAGNOSIS — L84 Corns and callosities: Secondary | ICD-10-CM

## 2020-09-08 DIAGNOSIS — M79675 Pain in left toe(s): Secondary | ICD-10-CM

## 2020-09-08 NOTE — Progress Notes (Signed)
Subjective: Sarah Cuevas is a 50 y.o. female patient who presents to office for evaluation of left greater than right foot pain secondary to callus skin/corn at fourth and fifth toes reports that the pain has gotten so bad that the toes that she has had to stop running states that she has resumed walking again and has some pain after about a mile.  Patient reports that she is going to [facility] soon and has to get her feet right.  Patient denies any other pedal complaints at this time.  Review of system noncontributory.  Patient Active Problem List   Diagnosis Date Noted  . Long term current use of anticoagulant 06/05/2019  . Mitral regurgitation 06/05/2019  . Long-term use of aspirin therapy 07/04/2017  . Bronchitis 06/06/2017  . Intermittent asthma with acute exacerbation 06/06/2017  . Paroxysmal atrial fibrillation (HCC) 06/06/2017    Current Outpatient Medications on File Prior to Visit  Medication Sig Dispense Refill  . albuterol (VENTOLIN HFA) 108 (90 Base) MCG/ACT inhaler INHALE 2 PUFFS 4 TIMES A DAY AS NEEDED    . apixaban (ELIQUIS) 5 MG TABS tablet Take 1 tablet by mouth 2 (two) times daily.    Marland Kitchen diltiazem (TIAZAC) 180 MG 24 hr capsule Take by mouth.    . flecainide (TAMBOCOR) 50 MG tablet Take by mouth.    . montelukast (SINGULAIR) 10 MG tablet Take 1 tablet by mouth daily.    . cefdinir (OMNICEF) 300 MG capsule Take 300 mg by mouth 2 (two) times daily.    Marland Kitchen diltiazem (CARDIZEM CD) 180 MG 24 hr capsule Take 180 mg by mouth daily.    . fluconazole (DIFLUCAN) 150 MG tablet Take 1 tablet (150 mg total) by mouth daily. Repeat in 2-3 days if symptoms persist. 2 tablet 0  . loratadine (CLARITIN) 10 MG tablet Take by mouth.    . Prenatal Multivit-Min-Fe-FA (PRENATAL VITAMINS PO) Take by mouth.    Marland Kitchen QVAR REDIHALER 40 MCG/ACT inhaler 1 puff daily.     No current facility-administered medications on file prior to visit.    Allergies  Allergen Reactions  . Cranberry Shortness Of  Breath    Objective:  General: Alert and oriented x3 in no acute distress  Dermatology: Keratotic lesion present medial fifth toes bilateral and submit 5 greater than submit 1 left greater than right foot with skin lines transversing the lesion, pain is present with direct pressure to the lesion with a central nucleated core noted, no webspace macerations, no ecchymosis bilateral, all nails x 10 are well manicured.  Vascular: Dorsalis Pedis and Posterior Tibial pedal pulses 1/4, Capillary Fill Time 3 seconds, + pedal hair growth bilateral, no edema bilateral lower extremities, Temperature gradient within normal limits.  Neurology: Michaell Cowing sensation intact via light touch bilateral.  Musculoskeletal: Mild tenderness with palpation at the keratotic lesion site on bilateral left greater than right, there is varus rotated fifth hammertoes, fat pad atrophy plantarly.  Muscular strength 5/5 in all groups without pain or limitation on range of motion.  Assessment and Plan: Problem List Items Addressed This Visit   None   Visit Diagnoses    Corns and callosities    -  Primary   Hammer toes of both feet       Toe pain, bilateral          -Complete examination performed -Discussed treatment options -Parred keratoic lesions x2 using a 15 blade without incident -Encouraged daily skin emollients -Encouraged use of pumice stone -Advised good supportive  shoes and inserts; shoe list provided -Patient to return to office as needed or sooner if condition worsens.  Advised patient if symptoms fail to improve may benefit in the future from hammertoe surgery.  Asencion Islam, DPM

## 2020-09-08 NOTE — Patient Instructions (Signed)

## 2022-05-17 ENCOUNTER — Ambulatory Visit (INDEPENDENT_AMBULATORY_CARE_PROVIDER_SITE_OTHER): Payer: BC Managed Care – PPO | Admitting: Podiatry

## 2022-05-17 ENCOUNTER — Encounter: Payer: Self-pay | Admitting: Podiatry

## 2022-05-17 ENCOUNTER — Ambulatory Visit (INDEPENDENT_AMBULATORY_CARE_PROVIDER_SITE_OTHER): Payer: BC Managed Care – PPO

## 2022-05-17 DIAGNOSIS — M778 Other enthesopathies, not elsewhere classified: Secondary | ICD-10-CM

## 2022-05-17 DIAGNOSIS — M722 Plantar fascial fibromatosis: Secondary | ICD-10-CM | POA: Diagnosis not present

## 2022-05-17 MED ORDER — METHYLPREDNISOLONE 4 MG PO TBPK: ORAL_TABLET | ORAL | 0 refills | Status: DC

## 2022-05-17 NOTE — Progress Notes (Signed)
  Subjective:  Patient ID: Sarah Cuevas, female    DOB: Nov 15, 1970,   MRN: [id number]  Chief Complaint  Patient presents with   Foot Pain    Plantar fasciitis     52 y.o. female presents for concern of heel pain of the right foot that has been going on for about a year. Relates pain in the mornings when she gets up as well as after running. She would like to get back more into running but her heels bother her primarily the right. Left has some pain. Has tried rolling frozen coke bottle on the bottom with some relief.     . Denies any other pedal complaints. Denies n/v/f/c.   History reviewed. No pertinent past medical history.  Objective:  Physical Exam: Vascular: DP/PT pulses 2/4 bilateral. CFT <3 seconds. Normal hair growth on digits. No edema.  Skin. No lacerations or abrasions bilateral feet. Hyperkeratoti lesions noted to lateral fourth digit bilateral Musculoskeletal: MMT 5/5 bilateral lower extremities in DF, PF, Inversion and Eversion. Deceased ROM in DF of ankle joint. Tender to medial calcaneal tubercle on the right. No pain along arch achilles tendon or PT tendon. No pain with calcaneal squeeze.  Neurological: Sensation intact to light touch.   Assessment:   1. Plantar fasciitis, right      Plan:  Patient was evaluated and treated and all questions answered. Discussed plantar fasciitis with patient.  X-rays reviewed and discussed with patient. No acute fractures or dislocations noted. Mild spurring noted at inferior calcaneus. Os tibiale externum noted. Mild midfoot degenerative changes noted.  Discussed treatment options including, ice, NSAIDS, supportive shoes, bracing, and stretching. Stretching exercises provided to be done on a daily basis.   Prescription for medrol dose pack  provided and sent to pharmacy. PF brace dispensed.  Discussed heloma molle and discussed shoe modification and provided toe caps. Hyperkeratosis debrided as courtesy today.  Follow-up 6  weeks or sooner if any problems arise. In the meantime, encouraged to call the office with any questions, concerns, change in symptoms.      Lorenda Peck, DPM

## 2022-05-17 NOTE — Patient Instructions (Signed)

## 2022-06-28 ENCOUNTER — Ambulatory Visit: Payer: BC Managed Care – PPO | Admitting: Podiatry

## 2022-07-12 ENCOUNTER — Encounter: Payer: Self-pay | Admitting: Podiatry

## 2022-07-12 ENCOUNTER — Ambulatory Visit (INDEPENDENT_AMBULATORY_CARE_PROVIDER_SITE_OTHER): Payer: BC Managed Care – PPO | Admitting: Podiatry

## 2022-07-12 DIAGNOSIS — M722 Plantar fascial fibromatosis: Secondary | ICD-10-CM

## 2022-07-12 NOTE — Progress Notes (Signed)
  Subjective:  Patient ID: Sarah Cuevas, female    DOB: [birth year],   MRN: [id number]  Chief Complaint  Patient presents with   Plantar Fasciitis    Patient here for plantar fas. In the right foot, stated that the brace helps some but pain still exists    52 y.o. female presents for follow-up of right plantar fasciiits. Relates she is doing better. More than 50% better. States she has been stretching but not consistantly and has been wearing brace   . Denies any other pedal complaints. Denies n/v/f/c.   No past medical history on file.  Objective:  Physical Exam: Vascular: DP/PT pulses 2/4 bilateral. CFT <3 seconds. Normal hair growth on digits. No edema.  Skin. No lacerations or abrasions bilateral feet. Hyperkeratoti lesions noted to lateral fourth digit bilateral Musculoskeletal: MMT 5/5 bilateral lower extremities in DF, PF, Inversion and Eversion. Deceased ROM in DF of ankle joint.Mildly tender to medial calcaneal tubercle on the right. No pain along arch achilles tendon or PT tendon. No pain with calcaneal squeeze.  Neurological: Sensation intact to light touch.   Assessment:   1. Plantar fasciitis, right       Plan:  Patient was evaluated and treated and all questions answered. Discussed plantar fasciitis with patient.  X-rays reviewed and discussed with patient. No acute fractures or dislocations noted. Mild spurring noted at inferior calcaneus. Os tibiale externum noted. Mild midfoot degenerative changes noted.  Discussed treatment options including, ice, NSAIDS, supportive shoes, bracing, and stretching. Continue stretching and brace.  Powersteps dispensed.  Follow-up as needed in future      Lorenda Peck, [place]

## 2022-08-28 ENCOUNTER — Ambulatory Visit: Payer: BLUE CROSS/BLUE SHIELD | Attending: Cardiology | Admitting: Cardiology

## 2022-08-28 ENCOUNTER — Encounter: Payer: Self-pay | Admitting: Cardiology

## 2022-08-28 VITALS — BP 122/79 | HR 62 | Ht 68.0 in | Wt 225.0 lb

## 2022-08-28 DIAGNOSIS — J4521 Mild intermittent asthma with (acute) exacerbation: Secondary | ICD-10-CM | POA: Diagnosis not present

## 2022-08-28 DIAGNOSIS — Z7901 Long term (current) use of anticoagulants: Secondary | ICD-10-CM | POA: Diagnosis not present

## 2022-08-28 DIAGNOSIS — I480 Paroxysmal atrial fibrillation: Secondary | ICD-10-CM | POA: Diagnosis not present

## 2022-08-28 NOTE — Patient Instructions (Signed)
Medication Instructions:  The current medical regimen is effective;  continue present plan and medications.  *If you need a refill on your cardiac medications before your next appointment, please call your pharmacy*  Follow-Up: At [facility], you and your health needs are our priority.  As part of our continuing mission to provide you with exceptional heart care, we have created designated Provider Care Teams.  These Care Teams include your primary Cardiologist (physician) and Advanced Practice Providers (APPs -  Physician Assistants and Nurse Practitioners) who all work together to provide you with the care you need, when you need it.  We recommend signing up for the patient portal called "MyChart".  Sign up information is provided on this After Visit Summary.  MyChart is used to connect with patients for Virtual Visits (Telemedicine).  Patients are able to view lab/test results, encounter notes, upcoming appointments, etc.  Non-urgent messages can be sent to your provider as well.   To learn more about what you can do with MyChart, go to [email or web address].    Your next appointment:   1 year(s)  Provider:   Mark Skains, MD      

## 2022-08-28 NOTE — Progress Notes (Signed)
Cardiology Office Note:    Date:  08/28/2022   ID:  Sarah Cuevas, DOB [birth year], MRN [id number]  PCP:  Buckner Malta, MD   [facility] Providers Cardiologist:  Donato Schultz, MD     Referring MD: Buckner Malta, MD    History of Present Illness:    Sarah Cuevas is a 53 y.o. female here for evaluation of atrial fibrillation at the request of Landis Martins MD. Previously has seen Dr. Sandy Salaam, notes reviewed. Last visit was 04/11/22:   She was a started on antiarrhythmic medication with flecainide and also rate controlled Cardizem CD 180 mg daily in addition with anticoagulation with apixaban 5 mg twice daily. She has done well she denies any complaint since last visit we have seen her she has not had any issue. Her EKG shows sinus rhythm with rate of 65 bpm. She has other comorbidity including intermittent asthma with acute exacerbation following up with her primary physician she is on inhalers. And also she continued her anticoagulation and she has also trace to mild mitral regurgitation. That is stable by 2D echocardiogram. No new complaints   Atrial fibrillation started in the early part of 2019 after influenza.  Initially paroxysmal but then it came back in September 2019.  Symptomatic.  She was began on anticoagulation then because of its persistent nature.  Nondiabetic no hypertension no coronary disease.  She underwent cardioversion in December 2019.  Her mother had atrial fibrillation.  Works as a Systems developer for [employer].  Enjoys Estate manager/land agent.    Past Medical History:  Diagnosis Date   Asthma    Mitral valve insufficiency    Paroxysmal atrial fibrillation (HCC)     No past surgical history on file.  Current Medications: Current Meds  Medication Sig   albuterol (VENTOLIN HFA) 108 (90 Base) MCG/ACT inhaler INHALE 2 PUFFS 4 TIMES A DAY AS NEEDED   apixaban (ELIQUIS) 5 MG TABS tablet Take 1 tablet by mouth 2 (two) times daily.   diltiazem  (CARDIZEM CD) 180 MG 24 hr capsule Take 180 mg by mouth daily.   flecainide (TAMBOCOR) 50 MG tablet Take by mouth.   loratadine (CLARITIN) 10 MG tablet Take by mouth.   montelukast (SINGULAIR) 10 MG tablet Take 1 tablet by mouth daily.   QVAR REDIHALER 40 MCG/ACT inhaler 1 puff daily.     Allergies:   Cranberry   Social History   Socioeconomic History   Marital status: Married    Spouse name: Not on file   Number of children: Not on file   Years of education: Not on file   Highest education level: Not on file  Occupational History   Not on file  Tobacco Use   Smoking status: Never   Smokeless tobacco: Never  Substance and Sexual Activity   Alcohol use: No   Drug use: No   Sexual activity: Yes    Birth control/protection: None  Other Topics Concern   Not on file  Social History Narrative   Not on file   Social Determinants of Health   Financial Resource Strain: Not on file  Food Insecurity: Not on file  Transportation Needs: Not on file  Physical Activity: Not on file  Stress: Not on file  Social Connections: Not on file     Family History: The patient's family history includes Arthritis in her mother; Atrial fibrillation in her mother; Cancer (age of onset: 48) in her maternal aunt; Heart disease in her paternal grandmother; Hypertension in  her mother; Lupus in her maternal grandmother and paternal aunt; Macular degeneration in her paternal grandmother; Meniere's disease in her mother.  ROS:   Please see the history of present illness.     All other systems reviewed and are negative.  EKGs/Labs/Other Studies Reviewed:    The following studies were reviewed today:    ECHO 02/06/2022 [facility] Normal LV size, wall thickness, wall motion and systolic function with  ejection fraction 60-65%  The right ventricle is mildly dilated.  The left atrium is mildly dilated.  The right atrium is mildly dilated.  There was insufficient TR detected to calculate RV systolic  pressure.  The inferior vena cava is mildly dilated with a decrease in  inspiratory collapse.    EKG: Normal sinus rhythm 62 QRS duration 92 ms.  Stable    Recent Labs: No results found for requested labs within last 365 days.  Recent Lipid Panel No results found for: "CHOL", "TRIG", "HDL", "CHOLHDL", "VLDL", "LDLCALC", "LDLDIRECT"   Risk Assessment/Calculations:    CHA2DS2-VASc Score = 1   This indicates a 0.6% annual risk of stroke. The patient's score is based upon: CHF History: 0 HTN History: 0 Diabetes History: 0 Stroke History: 0 Vascular Disease History: 0 Age Score: 0 Gender Score: 1                Physical Exam:    VS:  BP 122/79 (BP Location: Left Arm, Patient Position: Sitting, Cuff Size: Normal)   Pulse 62   Ht 5\' 8"  (1.727 m)   Wt 225 lb (102.1 kg)   BMI 34.21 kg/m     Wt Readings from Last 3 Encounters:  08/28/22 225 lb (102.1 kg)  05/28/13 209 lb 9.6 oz (95.1 kg)     GEN:  Well nourished, well developed in no acute distress HEENT: Normal NECK: No JVD; No carotid bruits LYMPHATICS: No lymphadenopathy CARDIAC: RRR, no murmurs, rubs, gallops RESPIRATORY:  Clear to auscultation without rales, wheezing or rhonchi  ABDOMEN: Soft, non-tender, non-distended MUSCULOSKELETAL:  No edema; No deformity  SKIN: Warm and dry NEUROLOGIC:  Alert and oriented x 3 PSYCHIATRIC:  Normal affect   ASSESSMENT:    1. Paroxysmal atrial fibrillation (HCC)   2. Mild intermittent asthma with acute exacerbation   3. Long term current use of anticoagulant    PLAN:    In order of problems listed above:  Paroxysmal atrial fibrillation - Continuing with flecainide 50 mg twice a day, Cardizem CD 180 mg daily.  Stable.  Maintaining sinus rhythm.  Enjoying exercise.  Running, swimming.  Symptoms with atrial fibrillation previously or dizziness.  Her mother has atrial fibrillation post ablation.  Chronic anticoagulation - On Eliquis 5 mg a day.  Hemoglobin and  creatinine monitored.  No major bleeding.  We discussed her CHA2DS2-VASc score of 0.  Continue to monitor high risk medication management.  Intermittent asthma/allergies - Using inhalers per Dr. Buckner Malta.  Difficult time of year for this.  Trace mitral regurgitation seen on 2023 echocardiogram.  Extensive review of care everywhere notes.            Medication Adjustments/Labs and Tests Ordered: Current medicines are reviewed at length with the patient today.  Concerns regarding medicines are outlined above.  Orders Placed This Encounter  Procedures   EKG 12-Lead   No orders of the defined types were placed in this encounter.   Patient Instructions  Medication Instructions:  The current medical regimen is effective;  continue present plan and medications.  *  If you need a refill on your cardiac medications before your next appointment, please call your pharmacy*  Follow-Up: At [facility], you and your health needs are our priority.  As part of our continuing mission to provide you with exceptional heart care, we have created designated Provider Care Teams.  These Care Teams include your primary Cardiologist (physician) and Advanced Practice Providers (APPs -  Physician Assistants and Nurse Practitioners) who all work together to provide you with the care you need, when you need it.  We recommend signing up for the patient portal called "MyChart".  Sign up information is provided on this After Visit Summary.  MyChart is used to connect with patients for Virtual Visits (Telemedicine).  Patients are able to view lab/test results, encounter notes, upcoming appointments, etc.  Non-urgent messages can be sent to your provider as well.   To learn more about what you can do with MyChart, go to [email or web address].    Your next appointment:   1 year(s)  Provider:   Donato Schultz, MD        Signed, Donato Schultz, MD  08/28/2022 9:56 AM    [facility]

## 2023-10-12 ENCOUNTER — Encounter (HOSPITAL_BASED_OUTPATIENT_CLINIC_OR_DEPARTMENT_OTHER): Payer: Self-pay | Admitting: Cardiology

## 2023-10-12 ENCOUNTER — Ambulatory Visit (HOSPITAL_BASED_OUTPATIENT_CLINIC_OR_DEPARTMENT_OTHER): Admitting: Cardiology

## 2023-10-12 ENCOUNTER — Ambulatory Visit (INDEPENDENT_AMBULATORY_CARE_PROVIDER_SITE_OTHER): Admitting: Cardiology

## 2023-10-12 VITALS — BP 138/80 | HR 68 | Ht 68.0 in | Wt 228.8 lb

## 2023-10-12 DIAGNOSIS — Z7901 Long term (current) use of anticoagulants: Secondary | ICD-10-CM | POA: Diagnosis not present

## 2023-10-12 DIAGNOSIS — I480 Paroxysmal atrial fibrillation: Secondary | ICD-10-CM

## 2023-10-12 NOTE — Progress Notes (Signed)
 Cardiology Office Note:  .   Date:  10/12/2023  ID:  Sarah Cuevas, DOB 11-Nov-1970, MRN [id number] PCP: Harvest Lineman, MD  [facility] Providers Cardiologist:  Dorothye Gathers, MD     History of Present Illness: .   Sarah Cuevas is a 53 y.o. female Discussed the use of AI scribe software for clinical note transcription with the patient, who gave verbal consent to proceed.  History of Present Illness Sarah Cuevas is a 53 year old female with paroxysmal atrial fibrillation who presents for evaluation.  She experiences palpitations and tachycardia. Her current medication regimen includes flecainide 50 mg twice daily, Cardizem CD 180 mg daily, and Eliquis 5 mg twice daily for chronic anticoagulation. No additional flare-ups have occurred since a previous episode.  In January, during her first attempt at a half marathon, she experienced a significant increase in heart rate, described as entering the 'red zone', and difficulty breathing at mile eleven, leading her to stop and receive medical attention.  She has a family history of atrial fibrillation, with her mother undergoing multiple ablations and her uncle recently surviving a massive stroke. She is monitoring her biological son for similar symptoms.  She is focusing on improving her diet by reducing fast food intake and incorporating healthier options. Her children consume a lot of fruit, which she sees as a positive dietary habit. She is an Systems developer for [employer] and is actively involved in her children's activities and dietary habits.      Studies Reviewed: Aaron Aas   EKG Interpretation Date/Time:  Friday October 12 2023 09:36:39 EDT Ventricular Rate:  68 PR Interval:  164 QRS Duration:  92 QT Interval:  420 QTC Calculation: 446 R Axis:   61  Text Interpretation: Normal sinus rhythm Low voltage QRS No previous ECGs available Confirmed by Dorothye Gathers ([id number]) on 10/12/2023 9:44:33 AM    Results LABS Hb:  13.9 g/dL Cr: 0.9 mg/dL ALT: 13 U/L  DIAGNOSTIC EKG: Sinus rhythm, 66 bpm, low voltage QRS (10/12/2023) Risk Assessment/Calculations:            Physical Exam:   VS:  BP 138/80   Pulse 68   Ht 5' 8 (1.727 m)   Wt 228 lb 12.8 oz (103.8 kg)   SpO2 97%   BMI 34.79 kg/m    Wt Readings from Last 3 Encounters:  10/12/23 228 lb 12.8 oz (103.8 kg)  08/28/22 225 lb (102.1 kg)  05/28/13 209 lb 9.6 oz (95.1 kg)    GEN: Well nourished, well developed in no acute distress NECK: No JVD; No carotid bruits CARDIAC: RRR, no murmurs, no rubs, no gallops RESPIRATORY:  Clear to auscultation without rales, wheezing or rhonchi  ABDOMEN: Soft, non-tender, non-distended EXTREMITIES:  No edema; No deformity   ASSESSMENT AND PLAN: .    Assessment and Plan Assessment & Plan Paroxysmal atrial fibrillation Paroxysmal atrial fibrillation is well-controlled with flecainide 50 mg twice daily and diltiazem 180 mg daily. She has not experienced any recent episodes, and EKG shows sinus rhythm. She is aware of the potential for ablation therapy if atrial fibrillation recurs despite the current medication regimen. Family history suggests a hereditary component, as she is the third generation affected. - Continue flecainide 50 mg twice daily. - Continue diltiazem 180 mg daily. - Consider ablation therapy if atrial fibrillation recurs.  Long term current use of anticoagulant She is on long-term anticoagulation with apixaban 5 mg twice daily for stroke prevention due to atrial fibrillation. She  understands the importance of anticoagulation, especially after a family member's experience with a stroke. - Continue apixaban 5 mg twice daily for stroke prevention.  Mild mitral valve regurgitation Mild mitral valve regurgitation with normal pump function. No changes in management are necessary at this time.         Dispo: 1 yr  Signed, Dorothye Gathers, MD

## 2023-10-12 NOTE — Patient Instructions (Signed)
